# Patient Record
Sex: Male | Born: 1965 | Race: White | Hispanic: No | Marital: Married | Smoking: Former smoker
Health system: Southern US, Community
[De-identification: ages and names within clinical notes are randomized; demographics above are authoritative.]

## PROBLEM LIST (undated history)

## (undated) DIAGNOSIS — E785 Hyperlipidemia, unspecified: Secondary | ICD-10-CM

## (undated) DIAGNOSIS — I10 Essential (primary) hypertension: Secondary | ICD-10-CM

## (undated) HISTORY — DX: Essential (primary) hypertension: I10

## (undated) HISTORY — PX: NO PAST SURGERIES: SHX2092

## (undated) HISTORY — DX: Hyperlipidemia, unspecified: E78.5

---

## 2018-10-05 ENCOUNTER — Ambulatory Visit (INDEPENDENT_AMBULATORY_CARE_PROVIDER_SITE_OTHER): Payer: BLUE CROSS/BLUE SHIELD

## 2018-10-05 ENCOUNTER — Ambulatory Visit (INDEPENDENT_AMBULATORY_CARE_PROVIDER_SITE_OTHER): Payer: BLUE CROSS/BLUE SHIELD | Admitting: Sports Medicine

## 2018-10-05 ENCOUNTER — Encounter: Payer: Self-pay | Admitting: Sports Medicine

## 2018-10-05 DIAGNOSIS — M25511 Pain in right shoulder: Secondary | ICD-10-CM | POA: Diagnosis not present

## 2018-10-05 DIAGNOSIS — M19011 Primary osteoarthritis, right shoulder: Secondary | ICD-10-CM

## 2018-10-05 DIAGNOSIS — S46011A Strain of muscle(s) and tendon(s) of the rotator cuff of right shoulder, initial encounter: Secondary | ICD-10-CM | POA: Diagnosis not present

## 2018-10-05 DIAGNOSIS — M75101 Unspecified rotator cuff tear or rupture of right shoulder, not specified as traumatic: Secondary | ICD-10-CM | POA: Insufficient documentation

## 2018-10-05 NOTE — Assessment & Plan Note (Signed)
Profound weakness to internal rotation, with a positive speeds, Yergason tests. Multiple positive impingement signs. I do think he tore his subscapularis, and likely labrum and biceps tendon. X-rays, formal physical therapy, but were also going to set him up for an MR arthrogram. Return for arthrogram injection.
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
## 2018-10-18 ENCOUNTER — Ambulatory Visit (INDEPENDENT_AMBULATORY_CARE_PROVIDER_SITE_OTHER): Payer: BLUE CROSS/BLUE SHIELD | Admitting: Sports Medicine

## 2018-10-18 ENCOUNTER — Ambulatory Visit (INDEPENDENT_AMBULATORY_CARE_PROVIDER_SITE_OTHER): Payer: BLUE CROSS/BLUE SHIELD

## 2018-10-18 DIAGNOSIS — S46011A Strain of muscle(s) and tendon(s) of the rotator cuff of right shoulder, initial encounter: Secondary | ICD-10-CM

## 2018-10-18 DIAGNOSIS — X58XXXA Exposure to other specified factors, initial encounter: Secondary | ICD-10-CM

## 2018-10-18 NOTE — Assessment & Plan Note (Signed)
Profound weakness to internal rotation with a positive speeds and Yergason tests. Multiple positive impingement signs. Suspect tears of the subscapularis, labrum, biceps tendon. X-rays, formal PT but MR arthrogram injection today. Return to see me after 4 to 6 weeks of physical therapy.

## 2018-10-18 NOTE — Progress Notes (Signed)
   Procedure: Real-time Ultrasound Guided gadolinium contrast injection of right glenohumeral joint Device: GE Logiq E  Verbal informed consent obtained.  Time-out conducted.  Noted no overlying erythema, induration, or other signs of local infection.  Skin prepped in a sterile fashion.  Local anesthesia: Topical Ethyl chloride.  With sterile technique and under real time ultrasound guidance: Using a 22-gauge spinal needle and a posterior approach I advanced into the glenohumeral joint taking care to avoid the labrum, I then injected 1 cc kenalog 40, 2 cc lidocaine, 2 cc bupivacaine, syringe switched and 5 cc Isovue injected, syringe again switched and 0.1 cc gadolinium injected, syringe again switched and 10 cc sterile saline used to flush the needle and fully distend the joint. Joint visualized and capsule seen distending confirming intra-articular placement of contrast material and medication. Completed without difficulty  Advised to call if fevers/chills, erythema, induration, drainage, or persistent bleeding.  Images permanently stored and available for review in the ultrasound unit.  Impression: Technically successful ultrasound guided gadolinium contrast injection for MR arthrography.  Please see separate MR arthrogram report.

## 2018-10-28 ENCOUNTER — Ambulatory Visit: Payer: BLUE CROSS/BLUE SHIELD | Admitting: Rehabilitative and Restorative Service Providers"

## 2018-11-02 ENCOUNTER — Ambulatory Visit: Payer: BLUE CROSS/BLUE SHIELD | Admitting: Physical Therapy

## 2018-11-02 ENCOUNTER — Encounter: Payer: Self-pay | Admitting: Physical Therapy

## 2018-11-02 DIAGNOSIS — G8929 Other chronic pain: Secondary | ICD-10-CM

## 2018-11-02 DIAGNOSIS — M25511 Pain in right shoulder: Secondary | ICD-10-CM

## 2018-11-02 DIAGNOSIS — R293 Abnormal posture: Secondary | ICD-10-CM | POA: Diagnosis not present
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
## 2018-11-02 NOTE — Patient Instructions (Signed)
Access Code: [email or web address]  URL: [email or web address]  Date: 11/02/2018  Prepared by: Moshe CiproStephanie Camaryn Lumbert   Exercises  Scapular Retraction with Resistance - 10 reps - 1 sets - 1x daily - 7x weekly  Scapular Retraction with Resistance Advanced - 10 reps - 1 sets - 1x daily - 7x weekly  Shoulder External Rotation and Scapular Retraction with Resistance - 10 reps - 1 sets - 5 sec hold - 1x daily - 7x weekly  Doorway Pec Stretch at 90 Degrees Abduction - 3 reps - 1 sets - 30 sec hold - 1x daily - 7x weekly

## 2018-11-11 ENCOUNTER — Encounter: Payer: BLUE CROSS/BLUE SHIELD | Admitting: Physical Therapy

## 2018-11-15 ENCOUNTER — Ambulatory Visit: Payer: BLUE CROSS/BLUE SHIELD | Admitting: Sports Medicine

## 2020-09-06 IMAGING — MR MR SHOULDER*R* W/CM
6 series · 40 of 40 positions shown · IV contrast (agent unspecified)
Comparison: None.

CLINICAL DATA: Lateral shoulder pain for 6 months. Started hurting
after playing baseball six months ago.

EXAM:
MR ARTHROGRAM OF THE RIGHT SHOULDER
TECHNIQUE: Multiplanar, multisequence MR imaging of the right shoulder was
performed following the administration of intra-articular contrast.
CONTRAST:  See Injection Documentation.

[Series 3: T1 fat-sat · axial · 4.0mm · 0.51mm/px · z∈[-33,+69]mm · 7 of 24 slices shown (1 of 4)]
[im 1/24]
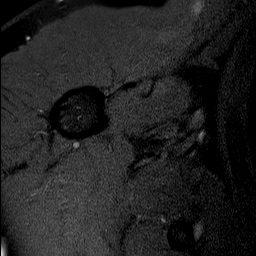
[im 4/24]
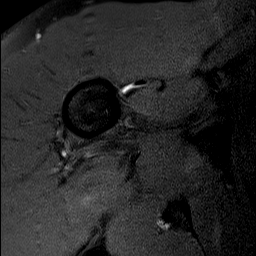
[im 8/24]
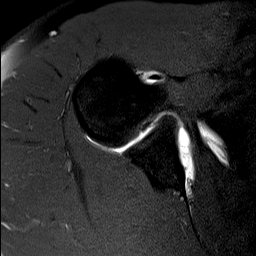
[im 12/24]
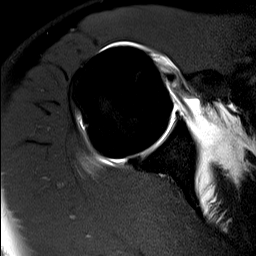
[im 16/24]
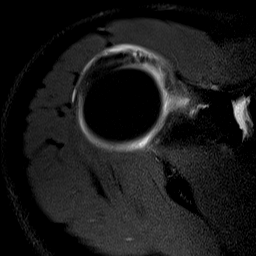
[im 20/24]
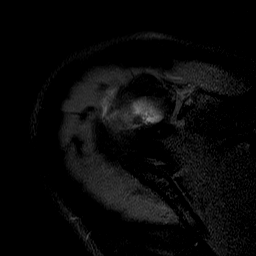
[im 24/24]
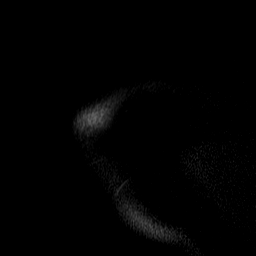

[Series 4: T1 fat-sat · oblique · 4.0mm · 0.59mm/px · 7 of 22 slices shown (2 of 4)]
[im 1/22]
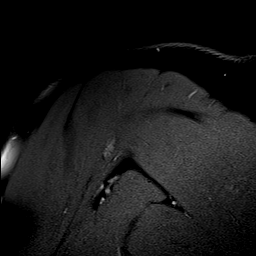
[im 4/22]
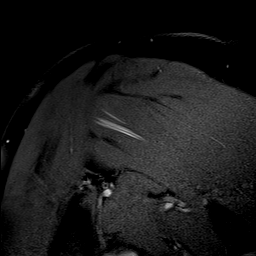
[im 8/22]
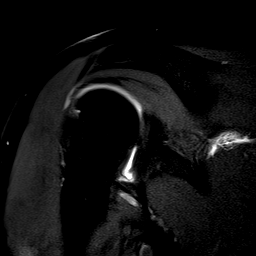
[im 11/22]
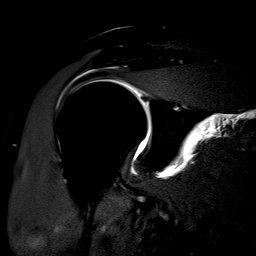
[im 15/22]
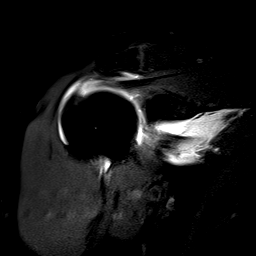
[im 18/22]
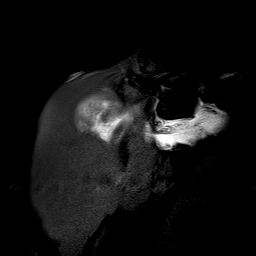
[im 22/22]
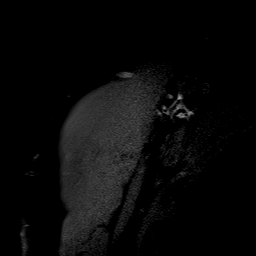

[Series 5: T2 fat-sat · oblique · 4.0mm · 0.59mm/px · 7 of 22 slices shown (1 of 2)]
[im 1/22]
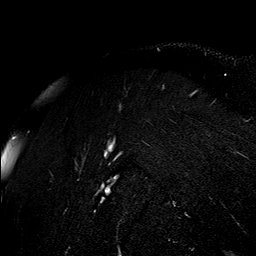
[im 4/22]
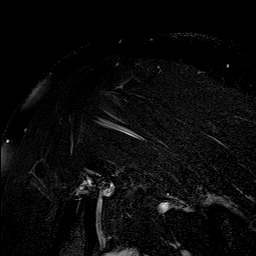
[im 8/22]
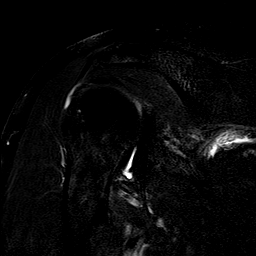
[im 11/22]
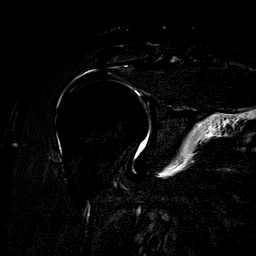
[im 15/22]
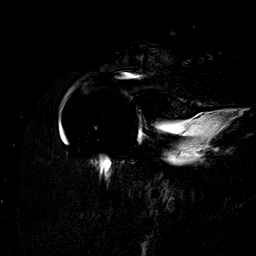
[im 18/22]
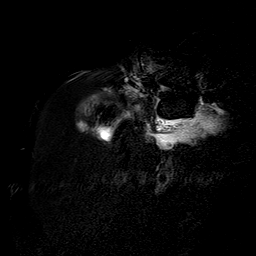
[im 22/22]
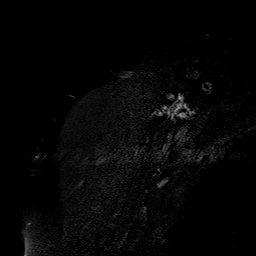

[Series 6: T1 fat-sat · oblique · non-contrast · 4.0mm · 0.47mm/px · 7 of 22 slices shown (3 of 4)]
[im 1/22]
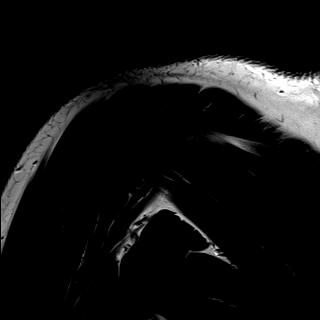
[im 4/22]
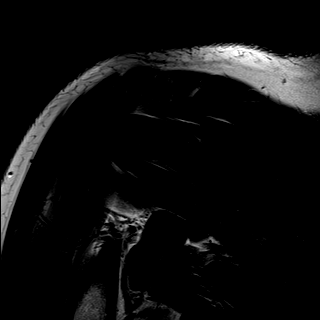
[im 8/22]
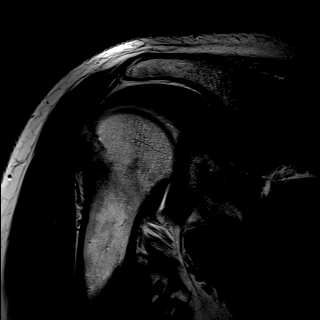
[im 11/22]
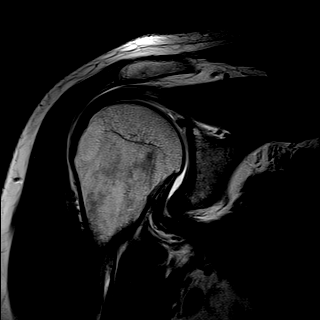
[im 15/22]
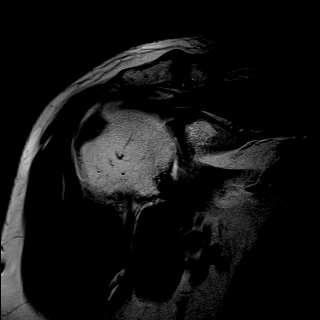
[im 18/22]
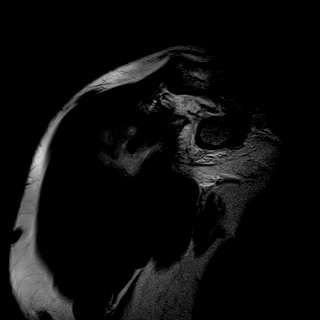
[im 22/22]
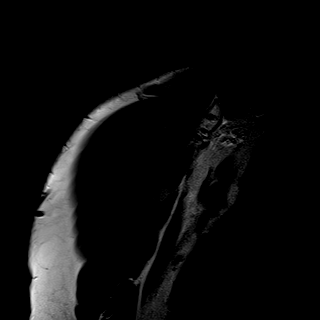

[Series 7: T2 fat-sat · oblique · 4.0mm · 0.59mm/px · 7 of 21 slices shown (2 of 2)]
[im 1/21]
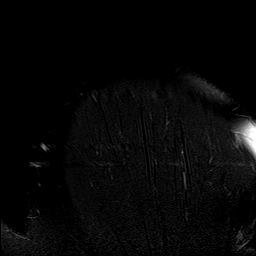
[im 4/21]
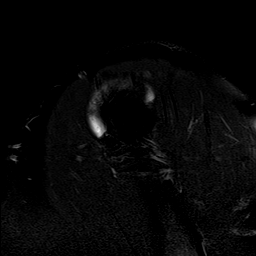
[im 7/21]
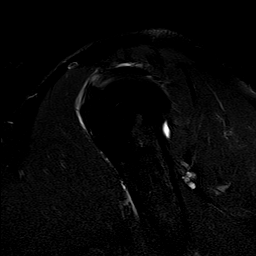
[im 11/21]
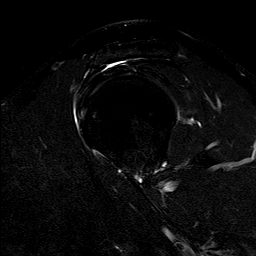
[im 14/21]
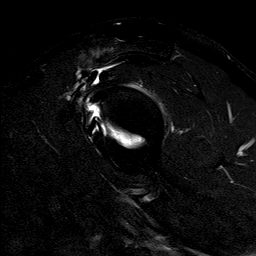
[im 17/21]
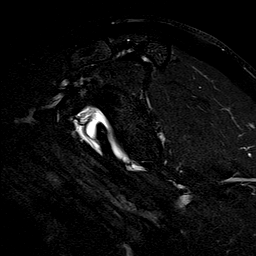
[im 21/21]
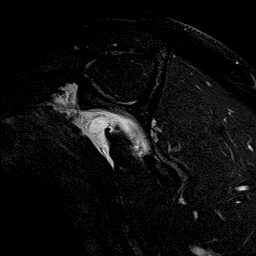

[Series 10: T1 fat-sat · sagittal · 4.0mm · 0.59mm/px · 5 of 17 slices shown (4 of 4)]
[im 1/17]
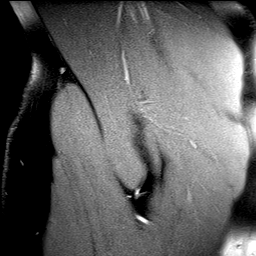
[im 5/17]
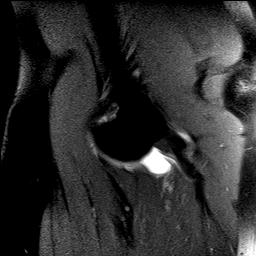
[im 9/17]
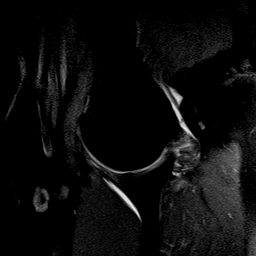
[im 13/17]
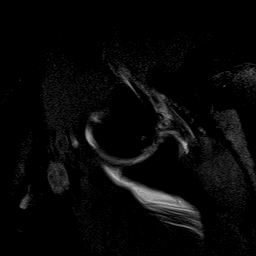
[im 17/17]
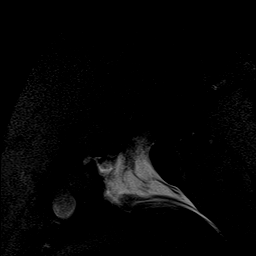

[40 of 40 positions shown; findings below may reference images not displayed]

FINDINGS: Rotator cuff: Severe tendinosis of the supraspinatus tendon with
fraying along the bursal surface. Mild tendinosis of the
infraspinatus tendon. Teres minor tendon is intact. Moderate
tendinosis of the subscapularis tendon with a high-grade
partial-thickness tear with a small full-thickness component.

Muscles: No atrophy or fatty replacement of nor abnormal signal
within, the muscles of the rotator cuff.

Biceps long head: Intact. Biceps tendon is medially subluxed likely
related to subscapularis tendon tear.

Acromioclavicular Joint: Normal acromioclavicular joint. Small
amount of contrast in the subacromial/subdeltoid bursa. Type I
acromion.

Glenohumeral Joint: Intraarticular contrast distending the joint
capsule. No chondral defect. Normal glenohumeral ligaments.

Labrum: Intact.

Bones: No aggressive osseous lesion.  No fracture or dislocation.
IMPRESSION: 1. Severe tendinosis of the supraspinatus tendon with fraying along
the bursal surface.
2. Mild tendinosis of the infraspinatus tendon.
3. Moderate tendinosis of the subscapularis tendon with a high-grade
partial-thickness tear with a small full-thickness component.
